# Patient Record
Sex: Male | Born: 1945 | Hispanic: No | Marital: Married | Smoking: Never smoker
Health system: Southern US, Community
[De-identification: ages and names within clinical notes are randomized; demographics above are authoritative.]

## PROBLEM LIST (undated history)

## (undated) DIAGNOSIS — K219 Gastro-esophageal reflux disease without esophagitis: Secondary | ICD-10-CM

## (undated) DIAGNOSIS — F419 Anxiety disorder, unspecified: Secondary | ICD-10-CM

## (undated) DIAGNOSIS — K5792 Diverticulitis of intestine, part unspecified, without perforation or abscess without bleeding: Secondary | ICD-10-CM

## (undated) DIAGNOSIS — IMO0002 Reserved for concepts with insufficient information to code with codable children: Secondary | ICD-10-CM

## (undated) DIAGNOSIS — I10 Essential (primary) hypertension: Secondary | ICD-10-CM

## (undated) HISTORY — DX: Reserved for concepts with insufficient information to code with codable children: IMO0002

## (undated) HISTORY — PX: APPENDECTOMY: SHX54

## (undated) HISTORY — DX: Essential (primary) hypertension: I10

## (undated) HISTORY — DX: Gastro-esophageal reflux disease without esophagitis: K21.9

## (undated) HISTORY — DX: Diverticulitis of intestine, part unspecified, without perforation or abscess without bleeding: K57.92

## (undated) HISTORY — DX: Anxiety disorder, unspecified: F41.9

## (undated) HISTORY — PX: CHOLECYSTECTOMY: SHX55

---

## 2003-08-01 ENCOUNTER — Other Ambulatory Visit: Payer: Self-pay

## 2008-04-25 ENCOUNTER — Ambulatory Visit: Payer: Self-pay | Admitting: Rheumatology

## 2008-04-27 ENCOUNTER — Ambulatory Visit: Payer: Self-pay | Admitting: Rheumatology

## 2008-08-05 ENCOUNTER — Ambulatory Visit: Payer: Self-pay

## 2008-09-29 ENCOUNTER — Ambulatory Visit: Payer: Self-pay | Admitting: Internal Medicine

## 2010-09-27 ENCOUNTER — Ambulatory Visit: Payer: Self-pay

## 2011-06-21 ENCOUNTER — Ambulatory Visit: Payer: Self-pay | Admitting: Anesthesiology

## 2011-06-26 ENCOUNTER — Encounter: Payer: Self-pay | Admitting: Physical Medicine & Rehabilitation

## 2011-07-12 ENCOUNTER — Encounter: Payer: Self-pay | Admitting: Physical Medicine & Rehabilitation

## 2011-07-12 ENCOUNTER — Encounter: Payer: Medicare Other | Attending: Physical Medicine & Rehabilitation

## 2011-07-12 ENCOUNTER — Ambulatory Visit (HOSPITAL_BASED_OUTPATIENT_CLINIC_OR_DEPARTMENT_OTHER): Payer: Medicare Other | Admitting: Physical Medicine & Rehabilitation

## 2011-07-12 VITALS — BP 121/82 | HR 66 | Resp 14 | Ht 70.0 in | Wt 218.0 lb

## 2011-07-12 DIAGNOSIS — M47817 Spondylosis without myelopathy or radiculopathy, lumbosacral region: Secondary | ICD-10-CM

## 2011-07-12 DIAGNOSIS — M51379 Other intervertebral disc degeneration, lumbosacral region without mention of lumbar back pain or lower extremity pain: Secondary | ICD-10-CM | POA: Insufficient documentation

## 2011-07-12 DIAGNOSIS — M5137 Other intervertebral disc degeneration, lumbosacral region: Secondary | ICD-10-CM | POA: Insufficient documentation

## 2011-07-12 DIAGNOSIS — M47816 Spondylosis without myelopathy or radiculopathy, lumbar region: Secondary | ICD-10-CM

## 2011-07-12 DIAGNOSIS — M48061 Spinal stenosis, lumbar region without neurogenic claudication: Secondary | ICD-10-CM | POA: Insufficient documentation

## 2011-07-12 DIAGNOSIS — G894 Chronic pain syndrome: Secondary | ICD-10-CM | POA: Insufficient documentation

## 2011-07-12 NOTE — Patient Instructions (Addendum)
We will need to obtain and analyze the urine drug screen prior to prescribing any type of narcotic analgesics.  If you cannot provide a urine sample for [place] I can still do the injection treatment that we discussed.   If you are running low he'll need to contact whoever prescribed these medications for you in the past.  I am recommending lumbar medial branch blocks. A pamphlet is enclosed.

## 2011-07-12 NOTE — Progress Notes (Signed)
Subjective:    Patient ID: Tyler Hicks, male    DOB: [birth year], 66 y.o.   MRN: [id number]  HPI Chronic low back pain for many years.stopped working in 2009 were 2010. Has been on OxyContin for 6 months. Has been on Vicodin for several years. Last CT was reviewed. The disc from [place] revealed L4-L5 and L5-S1 facet arthropathy as well as moderate central spinal stenosis at L4-L5 level. He has declined surgery in the past. Pain Inventory Average Pain 8 Pain Right Now 7 My pain is sharp and aching  In the last 24 hours, has pain interfered with the following? General activity 7 Relation with others 5 Enjoyment of life 10 What TIME of day is your pain at its worst? varies Sleep (in general) Poor  Pain is worse with: walking, sitting and standing Pain improves with: rest and medication Relief from Meds: 8  Mobility walk without assistance how many minutes can you walk? 5 ability to climb steps?  yes do you drive?  yes Do you have any goals in this area?  yes  Function disabled: date disabled 2009  Neuro/Psych numbness trouble walking  Prior Studies Any changes since last visit?  no  Physicians involved in your care Any changes since last visit?  no   History reviewed. No pertinent family history. History   Social History  . Marital Status: Unknown    Spouse Name: N/A    Number of Children: N/A  . Years of Education: N/A   Social History Main Topics  . Smoking status: Never Smoker   . Smokeless tobacco: Never Used  . Alcohol Use: No  . Drug Use: None  . Sexually Active: None   Other Topics Concern  . None   Social History Narrative  . None   Past Surgical History  Procedure Date  . Cholecystectomy   . Appendectomy    Past Medical History  Diagnosis Date  . Ulcer   . Diverticulitis    BP 121/82  Pulse 66  Resp 14  Ht 5\' 10"  (1.778 m)  Wt 218 lb (98.884 kg)  BMI 31.28 kg/m2  SpO2 95%     Review of Systems  Neurological: Positive  for numbness.  All other systems reviewed and are negative.       Objective:   Physical Exam  Musculoskeletal:       Lumbar back: He exhibits decreased range of motion and pain. He exhibits no swelling and no edema.       Pain with leaning toward the right side in the right low back area  Neurological: He has normal strength. A sensory deficit is present. He exhibits normal muscle tone. Gait normal.  Reflex Scores:      Patellar reflexes are 2+ on the right side and 2+ on the left side.      Achilles reflexes are 1+ on the right side and 1+ on the left side.      reduced sensation to pinprick in bilateral feet Motor strength is normal but some inconsistency with examination. When he ambulates there is no evidence of foot drop however he can barely raise up his ankle on annual muscle testing.  Psychiatric: His speech is normal. Judgment and thought content normal. His affect is blunt. He is slowed. Cognition and memory are normal.          Assessment & Plan:  1. Lumbar spinal stenosis 2 degenerative disc as well as lumbar facet arthropathy primarily at the L4-L5 level. No sign  of radiculopathy other than some foot numbness. This may be more do to a neuropathy and an EMG may be helpful in this regard. In regards to his back pain and lumbar spondylosis he may benefit from medial branch blocks however he declines this option 2. Chronic pain syndrome he is on high-dose narcotic analgesics greater than 100 mg morphine equivalence.patient states that he cannot provide a urine sample and I indicated that we would not feel as soon the prescription of his narcotic analgesics unless he is able to provide one.

## 2011-07-16 ENCOUNTER — Telehealth: Payer: Self-pay | Admitting: Physical Medicine & Rehabilitation

## 2011-07-16 NOTE — Telephone Encounter (Signed)
Per April, pt called back and I asked her to let pt know that we do UDS on a random basis. We do not let the pt decide when they want to give specimen.

## 2011-07-16 NOTE — Telephone Encounter (Signed)
Can he come this morning before lunch to give urine sample?

## 2011-07-16 NOTE — Telephone Encounter (Signed)
LM for pt to call back.

## 2011-07-17 ENCOUNTER — Telehealth: Payer: Self-pay | Admitting: Physical Medicine & Rehabilitation

## 2011-07-17 NOTE — Telephone Encounter (Signed)
Dr. Wynn Banker, This is the pt you had last week that states that he only urinates 3 times a day and couldn't give a specimen the day of his appointment. He has called several times wanting to know if he could just come in to give a specimen first thing in the morning. We have told him that we do UDS on a random basis and don't normally allow patients to just come in for a specimen. Could we make an exception for this pt?

## 2011-07-17 NOTE — Telephone Encounter (Signed)
Please call concerning this patient.  He is a legitimate patient and they want him to get some help.  What can they do to help?  Please call.

## 2011-07-18 NOTE — Telephone Encounter (Signed)
No exception

## 2011-08-06 ENCOUNTER — Ambulatory Visit: Payer: Self-pay | Admitting: Pain Medicine

## 2011-08-09 ENCOUNTER — Telehealth: Payer: Self-pay | Admitting: Physical Medicine & Rehabilitation

## 2011-08-16 ENCOUNTER — Ambulatory Visit: Payer: Medicare Other | Admitting: Physical Medicine & Rehabilitation

## 2011-10-07 ENCOUNTER — Encounter: Payer: Self-pay | Admitting: Anesthesiology

## 2011-10-20 ENCOUNTER — Encounter: Payer: Self-pay | Admitting: Anesthesiology

## 2012-04-05 ENCOUNTER — Emergency Department: Payer: Self-pay | Admitting: Internal Medicine

## 2012-04-07 ENCOUNTER — Emergency Department: Payer: Self-pay | Admitting: Emergency Medicine

## 2012-04-17 ENCOUNTER — Emergency Department: Payer: Self-pay | Admitting: Emergency Medicine

## 2017-05-05 ENCOUNTER — Other Ambulatory Visit: Payer: Self-pay | Admitting: Family Medicine

## 2017-05-05 ENCOUNTER — Ambulatory Visit
Admission: RE | Admit: 2017-05-05 | Discharge: 2017-05-05 | Disposition: A | Discharge status: Home / Self Care | Payer: Medicare Other | Source: Ambulatory Visit | Attending: Family Medicine | Admitting: Family Medicine

## 2017-05-05 DIAGNOSIS — R413 Other amnesia: Secondary | ICD-10-CM | POA: Diagnosis present

## 2017-05-05 DIAGNOSIS — R51 Headache: Secondary | ICD-10-CM | POA: Diagnosis present

## 2017-05-05 DIAGNOSIS — R2689 Other abnormalities of gait and mobility: Secondary | ICD-10-CM | POA: Insufficient documentation

## 2017-05-05 DIAGNOSIS — G319 Degenerative disease of nervous system, unspecified: Secondary | ICD-10-CM | POA: Insufficient documentation

## 2017-05-05 DIAGNOSIS — I7090 Unspecified atherosclerosis: Secondary | ICD-10-CM | POA: Diagnosis not present

## 2017-05-20 ENCOUNTER — Encounter (INDEPENDENT_AMBULATORY_CARE_PROVIDER_SITE_OTHER): Payer: Medicare Other | Admitting: Vascular Surgery

## 2017-05-22 ENCOUNTER — Encounter (INDEPENDENT_AMBULATORY_CARE_PROVIDER_SITE_OTHER): Payer: Self-pay | Admitting: Vascular Surgery

## 2017-05-22 ENCOUNTER — Ambulatory Visit (INDEPENDENT_AMBULATORY_CARE_PROVIDER_SITE_OTHER): Payer: Medicare Other | Admitting: Vascular Surgery

## 2017-05-22 DIAGNOSIS — I6523 Occlusion and stenosis of bilateral carotid arteries: Secondary | ICD-10-CM

## 2017-05-22 DIAGNOSIS — E785 Hyperlipidemia, unspecified: Secondary | ICD-10-CM | POA: Diagnosis not present

## 2017-05-22 DIAGNOSIS — I10 Essential (primary) hypertension: Secondary | ICD-10-CM | POA: Diagnosis not present

## 2017-05-23 ENCOUNTER — Ambulatory Visit
Admission: RE | Admit: 2017-05-23 | Discharge: 2017-05-23 | Disposition: A | Discharge status: Home / Self Care | Payer: Medicare Other | Source: Ambulatory Visit | Attending: Vascular Surgery | Admitting: Vascular Surgery

## 2017-05-23 ENCOUNTER — Ambulatory Visit (HOSPITAL_COMMUNITY): Payer: Medicare Other

## 2017-05-23 DIAGNOSIS — I6523 Occlusion and stenosis of bilateral carotid arteries: Secondary | ICD-10-CM

## 2017-05-23 DIAGNOSIS — I6529 Occlusion and stenosis of unspecified carotid artery: Secondary | ICD-10-CM | POA: Insufficient documentation

## 2017-05-23 DIAGNOSIS — I671 Cerebral aneurysm, nonruptured: Secondary | ICD-10-CM | POA: Insufficient documentation

## 2017-05-23 DIAGNOSIS — I6503 Occlusion and stenosis of bilateral vertebral arteries: Secondary | ICD-10-CM | POA: Insufficient documentation

## 2017-05-23 DIAGNOSIS — I708 Atherosclerosis of other arteries: Secondary | ICD-10-CM | POA: Insufficient documentation

## 2017-05-23 DIAGNOSIS — I6522 Occlusion and stenosis of left carotid artery: Secondary | ICD-10-CM | POA: Diagnosis not present

## 2017-05-23 DIAGNOSIS — I700 Atherosclerosis of aorta: Secondary | ICD-10-CM | POA: Insufficient documentation

## 2017-05-23 MED ORDER — IOHEXOL 350 MG/ML SOLN
75.0000 mL | Freq: Once | INTRAVENOUS | Status: AC | PRN
  Administered 2017-05-23: 75 mL via INTRAVENOUS

## 2017-05-24 ENCOUNTER — Encounter (INDEPENDENT_AMBULATORY_CARE_PROVIDER_SITE_OTHER): Payer: Self-pay | Admitting: Vascular Surgery

## 2017-05-24 DIAGNOSIS — I10 Essential (primary) hypertension: Secondary | ICD-10-CM | POA: Insufficient documentation

## 2017-05-24 DIAGNOSIS — E785 Hyperlipidemia, unspecified: Secondary | ICD-10-CM | POA: Insufficient documentation

## 2017-05-24 NOTE — Progress Notes (Signed)
Subjective:    Patient ID: Tyler Hicks, male    DOB: 10-19-1945, 72 y.o.   MRN: [id number] Chief Complaint  Patient presents with  . New Patient (Initial Visit)    ref Hande for Carotid Stenosis   Presents as a new patient referred by physician assistant Whitaker for evaluation of carotid artery stenosis.  The patient was seen with his wife.  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  The patient is very concerned about this loss of memory and how it may be "regained".  The patient denies any other neurological symptoms or deficits associated with this episode of blindness/memory loss.  The patient denies any episodes of amaurosis fugax before this episode as well. This prompted a carotid artery ultrasound which was notable for 50-69% stenosis in the bilateral internal carotid arteries.  Bilateral vertebral arteries were antegrade.  The patient's wife is concerned about his lack of energy and shortness of breath.  Patient denies any fever, nausea or vomiting.  Review of Systems  Constitutional: Negative.   HENT: Negative.   Eyes: Positive for visual disturbance.  Respiratory: Negative.   Cardiovascular: Negative.        Carotid stenosis  Gastrointestinal: Negative.   Endocrine: Negative.   Genitourinary: Negative.   Musculoskeletal: Negative.   Skin: Negative.   Allergic/Immunologic: Negative.   Neurological: Positive for weakness.       Memory loss  Hematological: Negative.   Psychiatric/Behavioral: Negative.       Objective:   Physical Exam  Constitutional: He is oriented to person, place, and time. He appears well-developed and well-nourished. No distress.  HENT:  Head: Normocephalic and atraumatic.  Eyes: Pupils are equal, round, and reactive to light. Conjunctivae are normal.  Neck: Normal range of motion.  Mild bilateral carotid bruits noted on exam  Cardiovascular:  Normal rate, regular rhythm, normal heart sounds and intact distal pulses.  Pulses:      Radial pulses are 2+ on the right side, and 2+ on the left side.  Pulmonary/Chest: Effort normal and breath sounds normal.  Musculoskeletal: Normal range of motion. He exhibits no edema.  Neurological: He is alert and oriented to person, place, and time.  Skin: Skin is warm and dry. He is not diaphoretic.  Psychiatric: He has a normal mood and affect. His behavior is normal. Judgment and thought content normal.  Vitals reviewed.  BP (!) 144/80 (BP Location: Right Arm)   Pulse (!) 55   Resp 16   Ht 5\' 11"  (1.803 m)   Wt 225 lb (102.1 kg)   BMI 31.38 kg/m   Past Medical History:  Diagnosis Date  . Anxiety   . Diverticulitis   . GERD (gastroesophageal reflux disease)   . Hypertension   . Ulcer    Social History   Socioeconomic History  . Marital status: Married    Spouse name: Not on file  . Number of children: Not on file  . Years of education: Not on file  . Highest education level: Not on file  Occupational History  . Not on file  Social Needs  . Financial resource strain: Not on file  . Food insecurity:    Worry: Not on file    Inability: Not on file  . Transportation needs:    Medical: Not on file    Non-medical: Not on file  Tobacco Use  . Smoking status:  Never Smoker  . Smokeless tobacco: Never Used  Substance and Sexual Activity  . Alcohol use: No  . Drug use: Not on file  . Sexual activity: Not on file  Lifestyle  . Physical activity:    Days per week: Not on file    Minutes per session: Not on file  . Stress: Not on file  Relationships  . Social connections:    Talks on phone: Not on file    Gets together: Not on file    Attends religious service: Not on file    Active member of club or organization: Not on file    Attends meetings of clubs or organizations: Not on file    Relationship status: Not on file  . Intimate partner violence:    Fear of current or  ex partner: Not on file    Emotionally abused: Not on file    Physically abused: Not on file    Forced sexual activity: Not on file  Other Topics Concern  . Not on file  Social History Narrative  . Not on file   Past Surgical History:  Procedure Laterality Date  . APPENDECTOMY    . CHOLECYSTECTOMY     Family History  Problem Relation Age of Onset  . Varicose Veins Neg Hx    Allergies  Allergen Reactions  . Amoxicillin-Pot Clavulanate Nausea Only and Nausea And Vomiting      Assessment & Plan:  Presents as a new patient referred by physician assistant Whitaker for evaluation of carotid artery stenosis.  The patient was seen with his wife.  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  The patient is very concerned about this loss of memory and how it may be "regained".  The patient denies any other neurological symptoms or deficits associated with this episode of blindness/memory loss.  The patient denies any episodes of amaurosis fugax before this episode as well. This prompted a carotid artery ultrasound which was notable for 50-69% stenosis in the bilateral internal carotid arteries.  Bilateral vertebral arteries were antegrade.  The patient's wife is concerned about his lack of energy and shortness of breath.  Patient denies any fever, nausea or vomiting.  1. Bilateral carotid artery stenosis - New Patient with 50-69% stenosis to the bilateral internal carotid arteries Patient with one episode of blindness and memory loss lasting for a couple of hours approximately 2 weeks ago Do to the severity of the patient's symptoms not correlating to the degree of stenosis seen on his carotid duplex I will order a CTA of the neck to assess the patient's anatomy and exact degree of contributing carotid artery disease Once the patient completes the CTA of the neck he will follow-up with Dr. Gilda Crease to  discuss the results The patient and his wife agree and expressed their understanding with the plan  - CT Angio Neck W/Cm &/Or Wo/Cm; Future - BUN+Creat; Future  2. Hyperlipidemia, unspecified hyperlipidemia type - Stable Encouraged good control as its slows the progression of atherosclerotic disease  3. Essential hypertension - Stable Encouraged good control as its slows the progression of atherosclerotic disease  Current Outpatient Medications on File Prior to Visit  Medication Sig Dispense Refill  . ALPRAZolam (XANAX) 1 MG tablet Take 1 mg by mouth 2 (two) times daily.    . Beclomethasone Dipropionate (QNASL) 80 MCG/ACT AERS Place into the nose.    Marland Kitchen  esomeprazole (NEXIUM) 40 MG capsule Take 40 mg by mouth daily before breakfast.    . gabapentin (NEURONTIN) 300 MG capsule Take 600 mg by mouth 3 (three) times daily.     Marland Kitchen. loratadine (CLARITIN) 10 MG tablet Take 10 mg by mouth daily.    . meloxicam (MOBIC) 15 MG tablet Take 15 mg by mouth daily.    . metoprolol tartrate (LOPRESSOR) 25 MG tablet Take 25 mg by mouth 2 (two) times daily.    Marland Kitchen. oxyCODONE (OXYCONTIN) 20 MG 12 hr tablet Take 20 mg by mouth every 12 (twelve) hours.    . potassium chloride SA (K-DUR,KLOR-CON) 20 MEQ tablet Take 20 mEq by mouth daily.    . silver sulfADIAZINE (SILVADENE) 1 % cream Apply topically.    . simvastatin (ZOCOR) 10 MG tablet take 1 tablet by mouth at bedtime    . tamsulosin (FLOMAX) 0.4 MG CAPS capsule   0  . esomeprazole (NEXIUM) 40 MG capsule Take 40 mg by mouth daily at 12 noon.    . hydrochlorothiazide (HYDRODIURIL) 25 MG tablet Take 25 mg by mouth daily.    Marland Kitchen. HYDROcodone-acetaminophen (NORCO) 10-325 MG per tablet Take 1 tablet by mouth every 6 (six) hours as needed.    . pseudoephedrine (SUDAFED) 30 MG tablet Take 30 mg by mouth 2 (two) times daily.     No current facility-administered medications on file prior to visit.    There are no Patient Instructions on file for this visit. No follow-ups  on file.  KIMBERLY A STEGMAYER, PA-C

## 2017-06-02 ENCOUNTER — Ambulatory Visit (INDEPENDENT_AMBULATORY_CARE_PROVIDER_SITE_OTHER): Payer: Medicare Other | Admitting: Vascular Surgery

## 2017-06-05 ENCOUNTER — Encounter (INDEPENDENT_AMBULATORY_CARE_PROVIDER_SITE_OTHER): Payer: Self-pay | Admitting: Vascular Surgery

## 2017-06-05 ENCOUNTER — Ambulatory Visit (INDEPENDENT_AMBULATORY_CARE_PROVIDER_SITE_OTHER): Payer: Medicare Other | Admitting: Vascular Surgery

## 2017-06-05 VITALS — BP 120/78 | HR 75 | Resp 17 | Ht 71.0 in | Wt 226.0 lb

## 2017-06-05 DIAGNOSIS — I720 Aneurysm of carotid artery: Secondary | ICD-10-CM

## 2017-06-05 DIAGNOSIS — I10 Essential (primary) hypertension: Secondary | ICD-10-CM

## 2017-06-05 DIAGNOSIS — I7771 Dissection of carotid artery: Secondary | ICD-10-CM | POA: Diagnosis not present

## 2017-06-05 DIAGNOSIS — E785 Hyperlipidemia, unspecified: Secondary | ICD-10-CM

## 2017-06-05 DIAGNOSIS — I6523 Occlusion and stenosis of bilateral carotid arteries: Secondary | ICD-10-CM

## 2017-06-07 ENCOUNTER — Encounter (INDEPENDENT_AMBULATORY_CARE_PROVIDER_SITE_OTHER): Payer: Self-pay | Admitting: Vascular Surgery

## 2017-06-07 DIAGNOSIS — I7771 Dissection of carotid artery: Secondary | ICD-10-CM | POA: Insufficient documentation

## 2017-06-07 DIAGNOSIS — I720 Aneurysm of carotid artery: Secondary | ICD-10-CM | POA: Insufficient documentation

## 2017-06-07 NOTE — Progress Notes (Signed)
MRN : [id number]  Tyler Hicks is a 72 y.o. (Jun 22, 1945) male who presents with chief complaint of  Chief Complaint  Patient presents with  . Follow-up    ct results  .  History of Present Illness:  The patient is seen for follow up evaluation of carotid stenosis status post CT angiogram. CT scan was done 05/23/2017. Patient reports that the test went well with no problems or complications.   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  He has not had a recurrence.  The patient is very concerned about this loss of memory and how it may be "regained" because he apparently misplaced a large sum of money and can't find it.  The patient denies any other neurological symptoms or deficits associated with this episode of blindness/memory loss.  The patient denies interval amaurosis fugax. There is no recent or interval TIA symptoms or focal motor deficits. There is no prior documented CVA.  The patient is taking enteric-coated aspirin 81 mg daily.  There is no history of migraine headaches. There is no history of seizures.  The patient has a history of coronary artery disease, no recent episodes of angina or shortness of breath. The patient denies PAD or claudication symptoms. There is a history of hyperlipidemia which is being treated with a statin.    CT angiogram is reviewed by me personally and shows approximately 60% bilateral stenosis at the origins of the internal carotid arteries.  On the right side the stenosis is associated with a chronic dissection.  Also noted is a 8 mm distal internal carotid artery aneurysm on the right.  This is located just below the siphon.    Current Meds  Medication Sig  . ALPRAZolam (XANAX) 1 MG tablet Take 1 mg by mouth 2 (two) times daily.  . Beclomethasone Dipropionate (QNASL) 80 MCG/ACT AERS Place into the nose.  . esomeprazole (NEXIUM) 40 MG  capsule Take 40 mg by mouth daily before breakfast.  . esomeprazole (NEXIUM) 40 MG capsule Take 40 mg by mouth daily at 12 noon.  . gabapentin (NEURONTIN) 300 MG capsule Take 600 mg by mouth 3 (three) times daily.   . hydrochlorothiazide (HYDRODIURIL) 25 MG tablet Take 25 mg by mouth daily.  Marland Kitchen HYDROcodone-acetaminophen (NORCO) 10-325 MG per tablet Take 1 tablet by mouth every 6 (six) hours as needed.  . loratadine (CLARITIN) 10 MG tablet Take 10 mg by mouth daily.  . meloxicam (MOBIC) 15 MG tablet Take 15 mg by mouth daily.  . metoprolol tartrate (LOPRESSOR) 25 MG tablet Take 25 mg by mouth 2 (two) times daily.  Marland Kitchen oxyCODONE (OXYCONTIN) 20 MG 12 hr tablet Take 20 mg by mouth every 12 (twelve) hours.  . potassium chloride SA (K-DUR,KLOR-CON) 20 MEQ tablet Take 20 mEq by mouth daily.  . pseudoephedrine (SUDAFED) 30 MG tablet Take 30 mg by mouth 2 (two) times daily.  . silver sulfADIAZINE (SILVADENE) 1 % cream Apply topically.  . simvastatin (ZOCOR) 10 MG tablet take 1 tablet by mouth at bedtime  . tamsulosin (FLOMAX) 0.4 MG CAPS capsule     Past Medical History:  Diagnosis Date  . Anxiety   . Diverticulitis   . GERD (gastroesophageal reflux disease)   . Hypertension   . Ulcer     Past Surgical History:  Procedure Laterality Date  . APPENDECTOMY    . CHOLECYSTECTOMY  Social History Social History   Tobacco Use  . Smoking status: Never Smoker  . Smokeless tobacco: Never Used  Substance Use Topics  . Alcohol use: No  . Drug use: Not on file    Family History Family History  Problem Relation Age of Onset  . Varicose Veins Neg Hx     Allergies  Allergen Reactions  . Amoxicillin-Pot Clavulanate Nausea Only and Nausea And Vomiting     REVIEW OF SYSTEMS (Negative unless checked)  Constitutional: [] Weight loss  [] Fever  [] Chills Cardiac: [] Chest pain   [] Chest pressure   [] Palpitations   [] Shortness of breath when laying flat   [] Shortness of breath with  exertion. Vascular:  [] Pain in legs with walking   [] Pain in legs at rest  [] History of DVT   [] Phlebitis   [] Swelling in legs   [] Varicose veins   [] Non-healing ulcers Pulmonary:   [] Uses home oxygen   [] Productive cough   [] Hemoptysis   [] Wheeze  [] COPD   [] Asthma Neurologic:  [] Dizziness   [] Seizures   [] History of stroke   [] History of TIA  [] Aphasia   [] Vissual changes   [] Weakness or numbness in arm   [] Weakness or numbness in leg Musculoskeletal:   [] Joint swelling   [] Joint pain   [] Low back pain Hematologic:  [] Easy bruising  [] Easy bleeding   [] Hypercoagulable state   [] Anemic Gastrointestinal:  [] Diarrhea   [] Vomiting  [] Gastroesophageal reflux/heartburn   [] Difficulty swallowing. Genitourinary:  [] Chronic kidney disease   [] Difficult urination  [] Frequent urination   [] Blood in urine Skin:  [] Rashes   [] Ulcers  Psychological:  [] History of anxiety   []  History of major depression.  Physical Examination  Vitals:   06/05/17 1125  BP: 120/78  Pulse: 75  Resp: 17  Weight: 226 lb (102.5 kg)  Height: 5\' 11"  (1.803 m)   Body mass index is 31.52 kg/m. Gen: WD/WN, NAD Head: [place]/AT, No temporalis wasting.  Ear/Nose/Throat: Hearing grossly intact, nares w/o erythema or drainage Eyes: PER, EOMI, sclera nonicteric.  Neck: Supple, no large masses.   Pulmonary:  Good air movement, no audible wheezing bilaterally, no use of accessory muscles.  Cardiac: RRR, no JVD Vascular:  Bilateral carotid bruits Vessel Right Left  Radial Palpable Palpable  Ulnar Palpable Palpable  Brachial Palpable Palpable  Carotid Palpable Palpable  Gastrointestinal: Non-distended. No guarding/no peritoneal signs.  Musculoskeletal: M/S 5/5 throughout.  No deformity or atrophy.  Neurologic: CN 2-12 intact. Symmetrical.  Speech is fluent. Motor exam as listed above. Psychiatric: Judgment intact, Mood & affect appropriate for pt's clinical situation. Dermatologic: No rashes or ulcers noted.  No changes  consistent with cellulitis. Lymph : No lichenification or skin changes of chronic lymphedema.  CBC No results found for: WBC, HGB, HCT, MCV, PLT  BMET No results found for: NA, K, CL, CO2, GLUCOSE, BUN, CREATININE, CALCIUM, GFRNONAA, GFRAA CrCl cannot be calculated (No order found.).  COAG No results found for: INR, PROTIME  Radiology Ct Angio Neck W/cm &/or Wo/cm  Result Date: 05/23/2017 CLINICAL DATA:  Recent TIA. Memory loss and visual disturbances for week. Follow-up carotid artery stenosis. EXAM: CT ANGIOGRAPHY NECK TECHNIQUE: Multidetector CT imaging of the neck was performed using the standard protocol during bolus administration of intravenous contrast. Multiplanar CT image reconstructions and MIPs were obtained to evaluate the vascular anatomy. Carotid stenosis measurements (when applicable) are obtained utilizing NASCET criteria, using the distal internal carotid diameter as the denominator. CONTRAST:  75mL OMNIPAQUE IOHEXOL 350 MG/ML SOLN COMPARISON:  Carotid ultrasound  May 15, 2017 FINDINGS: AORTIC ARCH: Normal appearance of the thoracic arch, 2 vessel arch is a normal variant. Mild intimal irregularity and calcific atherosclerosis. Moderate LEFT Common carotid artery origin stenosis. Moderate tandem stenosis bilateral subclavian arteries. RIGHT CAROTID SYSTEM: Common carotid artery is patent. Dissection flap with 3 x 4 mm medially directed patent pseudoaneurysm. No hemodynamically significant stenosis by NASCET criteria. 8 mm fusiform aneurysm distal RIGHT cervical internal carotid artery. LEFT CAROTID SYSTEM: Common carotid artery is patent. Intimal thickening resulting in 60% focal stenosis LEFT internal carotid artery by NASCET criteria. 3 x 3 mm outpouching LEFT internal carotid artery origin (coronal 122/255) VERTEBRAL ARTERIES:Moderate stenosis bilateral vertebral artery origins. Vessels are patent. RIGHT vertebral artery is dominant. Tandem stenosis LEFT vertebral artery, severe  in mid V2 segment. SKELETON: No acute osseous process though bone windows have not been submitted. Poor dentition. 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 Moderate to severe bilateral C5-6 neural foraminal narrowing. OTHER NECK: Soft tissues of the neck are nonacute though, not tailored for evaluation. UPPER CHEST: Included lung apices are clear. Subcentimeter paratracheal nonspecific lymph nodes. IMPRESSION: 1. 60% stenosis LEFT internal carotid artery origin. 3 mm LEFT ICA origin outpouching concerning for ulcerated plaque, less likely pseudoaneurysm. 2. Old RIGHT internal carotid artery origin dissection with 3 x 4 mm intact pseudoaneurysm versus ulcerative plaque. No hemodynamically significant stenosis. 8 mm distal RIGHT ICA aneurysm. 3. Atherosclerosis resulting in moderate stenoses bilateral subclavian arteries. 4. Moderate stenosis bilateral vertebral artery origins. Tandem stenosis LEFT vertebral artery, severe in mid V2 segment seen with old dissection or atherosclerosis. 5. These results will be called to the ordering clinician or representative by the Radiology Department at the imaging location. Aortic Atherosclerosis (ICD10-I70.0). Electronically Signed   By: Awilda Metroourtnay  Bloomer M.D.   On: 05/23/2017 18:54    Assessment/Plan 1. Bilateral carotid artery stenosis Recommend:  Given the patient's asymptomatic subcritical stenosis no further invasive testing or surgery at this time.  Duplex ultrasound shows 60% stenosis bilaterally.  Continue antiplatelet therapy as prescribed Continue management of CAD, HTN and Hyperlipidemia Healthy heart diet,  encouraged exercise at least 4 times per week Follow up in 6 months with duplex ultrasound and physical exam based on 60% stenosis of the bilateral carotid artery   Given the memory loss and visual changes which is a global and bilateral episode I will ask neurology to evaluate.  There is nothing on the CT scan that would explain these  symptoms.   A total of 25 minutes was spent with this patient and greater than 50% was spent in counseling and coordination of care with the patient.  Discussion included the treatment options for vascular disease including indications for surgery and intervention.  Also discussed is the appropriate timing of treatment.  In addition medical therapy was discussed.  2. Carotid artery dissection (HCC) This will be monitored as with the carotid stenosis  3. Carotid aneurysm, right (HCC) See #1 - CT ANGIO NECK W OR WO CONTRAST; Future  4. Essential hypertension Continue antihypertensive medications as already ordered, these medications have been reviewed and there are no changes at this time.   5. Hyperlipidemia, unspecified hyperlipidemia type Continue statin as ordered and reviewed, no changes at this time     Levora DredgeGregory Shekina Cordell, MD  06/07/2017 11:51 AM

## 2017-06-16 ENCOUNTER — Telehealth (INDEPENDENT_AMBULATORY_CARE_PROVIDER_SITE_OTHER): Payer: Self-pay | Admitting: Vascular Surgery

## 2017-06-16 NOTE — Telephone Encounter (Signed)
Patient was referred to Dr. Margaretmary Eddy office, they will call him to schedule his appt.

## 2017-11-29 ENCOUNTER — Encounter: Payer: Self-pay | Admitting: Physician Assistant

## 2017-11-29 ENCOUNTER — Emergency Department
Admission: EM | Admit: 2017-11-29 | Discharge: 2017-11-30 | Disposition: A | Discharge status: Home / Self Care | Payer: Medicare Other | Attending: Emergency Medicine | Admitting: Emergency Medicine

## 2017-11-29 ENCOUNTER — Emergency Department: Payer: Medicare Other

## 2017-11-29 ENCOUNTER — Other Ambulatory Visit: Payer: Self-pay

## 2017-11-29 DIAGNOSIS — Y999 Unspecified external cause status: Secondary | ICD-10-CM | POA: Insufficient documentation

## 2017-11-29 DIAGNOSIS — Z79899 Other long term (current) drug therapy: Secondary | ICD-10-CM | POA: Insufficient documentation

## 2017-11-29 DIAGNOSIS — Y929 Unspecified place or not applicable: Secondary | ICD-10-CM | POA: Diagnosis not present

## 2017-11-29 DIAGNOSIS — Y939 Activity, unspecified: Secondary | ICD-10-CM | POA: Diagnosis not present

## 2017-11-29 DIAGNOSIS — S20219A Contusion of unspecified front wall of thorax, initial encounter: Secondary | ICD-10-CM | POA: Diagnosis not present

## 2017-11-29 DIAGNOSIS — W01190A Fall on same level from slipping, tripping and stumbling with subsequent striking against furniture, initial encounter: Secondary | ICD-10-CM | POA: Insufficient documentation

## 2017-11-29 DIAGNOSIS — I10 Essential (primary) hypertension: Secondary | ICD-10-CM | POA: Diagnosis not present

## 2017-11-29 DIAGNOSIS — S0181XA Laceration without foreign body of other part of head, initial encounter: Secondary | ICD-10-CM | POA: Diagnosis present

## 2017-11-29 MED ORDER — LIDOCAINE HCL (PF) 1 % IJ SOLN
5.0000 mL | Freq: Once | INTRAMUSCULAR | Status: DC
Start: 2017-11-29 — End: 2017-11-30
  Filled 2017-11-29: qty 5

## 2017-11-29 NOTE — ED Notes (Signed)
Pt  Got dizzy and fell  Did  Not pass  Out fell  Hit  Chin and  Chest  Pt has  Pain in chest that can be reproduced  Hurts to take deep breath  Bleeding has  Subsided

## 2017-11-29 NOTE — Discharge Instructions (Addendum)
Keep the wound clean and dry. See Dr. Marcello Fennel in 3-5 days for suture removal. Wash the area with soap & water as needed.

## 2017-11-29 NOTE — ED Triage Notes (Signed)
Patient reports he spun around really quickly and became dizzy and fell on to table.  Denies loss of consciousness.  Patient with laceration to chin.

## 2017-11-30 NOTE — ED Provider Notes (Signed)
[facility] Emergency Department Provider Note ____________________________________________  Time seen: 2243  I have reviewed the triage vital signs and the nursing notes.  HISTORY  Chief Complaint  Laceration  HPI Tyler Hicks is a 72 y.o. male resents to the ED accompanied by his wife, for evaluation of a laceration to his chin.  Patient describes he spun around quickly and became lightheaded and dizzy.  He apparently fell hitting his chin on the table.  There was no reported loss of consciousness, dental injury, facial contusion, nosebleed, or neck pain.  Patient admits to some discomfort across his anterior chest when he hit the table.  No shortness of breath, distal paresthesias, or nonreproducible chest pain is noted.  Past Medical History:  Diagnosis Date  . Anxiety   . Diverticulitis   . GERD (gastroesophageal reflux disease)   . Hypertension   . Ulcer     Patient Active Problem List   Diagnosis Date Noted  . 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 Hyperlipidemia 05/24/2017  . Essential hypertension 05/24/2017  . Carotid stenosis 05/23/2017  . Lumbar spondylosis 07/12/2011  . Lumbar spinal stenosis 07/12/2011    Past Surgical History:  Procedure Laterality Date  . APPENDECTOMY    . CHOLECYSTECTOMY      Prior to Admission medications   Medication Sig Start Date End Date Taking? Authorizing Provider  ALPRAZolam Prudy Feeler) 1 MG tablet Take 1 mg by mouth 2 (two) times daily.    [provider]  Beclomethasone Dipropionate (QNASL) 80 MCG/ACT AERS Place into the nose. 01/18/14   [provider]  esomeprazole (NEXIUM) 40 MG capsule Take 40 mg by mouth daily before breakfast.    [provider]  esomeprazole (NEXIUM) 40 MG capsule Take 40 mg by mouth daily at 12 noon.    [provider]  gabapentin (NEURONTIN) 300 MG capsule Take 600 mg by mouth 3 (three) times  daily.     [provider]  hydrochlorothiazide (HYDRODIURIL) 25 MG tablet Take 25 mg by mouth daily.    [provider]  HYDROcodone-acetaminophen (NORCO) 10-325 MG per tablet Take 1 tablet by mouth every 6 (six) hours as needed.    [provider]  loratadine (CLARITIN) 10 MG tablet Take 10 mg by mouth daily.    [provider]  meloxicam (MOBIC) 15 MG tablet Take 15 mg by mouth daily.    [provider]  metoprolol tartrate (LOPRESSOR) 25 MG tablet Take 25 mg by mouth 2 (two) times daily.    [provider]  oxyCODONE (OXYCONTIN) 20 MG 12 hr tablet Take 20 mg by mouth every 12 (twelve) hours.    [provider]  potassium chloride SA (K-DUR,KLOR-CON) 20 MEQ tablet Take 20 mEq by mouth daily.    [provider]  pseudoephedrine (SUDAFED) 30 MG tablet Take 30 mg by mouth 2 (two) times daily.    [provider]  simvastatin (ZOCOR) 10 MG tablet take 1 tablet by mouth at bedtime 07/10/15   [provider]  tamsulosin (FLOMAX) 0.4 MG CAPS capsule  04/27/17   [provider]    Allergies Amoxicillin-pot clavulanate  Family History  Problem Relation Age of Onset  . Varicose Veins Neg Hx     Social History Social History   Tobacco Use  . Smoking status: Never Smoker  . Smokeless tobacco: Never Used  Substance Use Topics  . Alcohol use: No  . Drug use:  Not on file    Review of Systems  Constitutional: Negative for fever. Eyes: Negative for visual changes. ENT: Negative for sore throat. Cardiovascular: Negative for chest pain. Respiratory: Negative for shortness of breath. Gastrointestinal: Negative for abdominal pain, vomiting and diarrhea. Genitourinary: Negative for dysuria. Musculoskeletal: Negative for back pain.  Anterior chest wall pain as above. Skin: Negative for rash.  Chin laceration as above.   Neurological: Negative for headaches, focal weakness or  numbness. ____________________________________________  PHYSICAL EXAM:  VITAL SIGNS: ED Triage Vitals [11/29/17 2041]  Enc Vitals Group     BP 106/87     Pulse Rate 93     Resp 18     Temp 98.8 F (37.1 C)     Temp Source Oral     SpO2 96 %     Weight 232 lb (105.2 kg)     Height 6' (1.829 m)     Head Circumference      Peak Flow      Pain Score 4     Pain Loc      Pain Edu?      Excl. in GC?     Constitutional: Alert and oriented. Well appearing and in no distress. Head: Normocephalic and atraumatic, for a linear laceration to the inferior portion of the chin under the mandible.  No active bleeding is appreciated.. Eyes: Conjunctivae are normal. Normal extraocular movements Neck: Supple. Normal ROM Cardiovascular: Normal rate, regular rhythm. Normal distal pulses. Respiratory: Normal respiratory effort. No wheezes/rales/rhonchi. Musculoskeletal: Nontender with normal range of motion in all extremities.  Neurologic:  Normal gait without ataxia. Normal speech and language. No gross focal neurologic deficits are appreciated. Skin:  Skin is warm, dry and intact. No rash noted. ____________________________________________   RADIOLOGY  CXR  IMPRESSION: No evidence of active pulmonary disease. Slight fibrosis in the lung bases. ____________________________________________  PROCEDURES  .Marland KitchenLaceration Repair Date/Time: 11/30/2017 12:18 AM Performed by: Lissa Hoard, PA-C Authorized by: Lissa Hoard, PA-C   Consent:    Consent obtained:  Verbal   Consent given by:  Patient   Risks discussed:  Pain, poor cosmetic result and poor wound healing Anesthesia (see MAR for exact dosages):    Anesthesia method:  Local infiltration   Local anesthetic:  Lidocaine 1% w/o epi Laceration details:    Location:  Face   Face location:  Chin   Length (cm):  2.5 Repair type:    Repair type:  Simple Pre-procedure details:    Preparation:  Patient was prepped  and draped in usual sterile fashion Treatment:    Area cleansed with:  Betadine   Amount of cleaning:  Standard   Irrigation solution:  Sterile saline   Irrigation method:  Syringe Skin repair:    Repair method:  Sutures   Suture size:  4-0   Suture material:  Nylon   Suture technique:  Simple interrupted   Number of sutures:  4 Approximation:    Approximation:  Close Post-procedure details:    Dressing:  Open (no dressing)   Patient tolerance of procedure:  Tolerated well, no immediate complications  ____________________________________________  INITIAL IMPRESSION / ASSESSMENT AND PLAN / ED COURSE  Patient with ED evaluation of injury sustained following mechanical fall.  Patient sustained a linear laceration to the chin and a chest wall contusion.  He is reassured by his negative x-ray.  His chin is repaired with sutures with good wound edge approximation.  He is discharged with instructions on wound care  management, and will see his provider in 3 to 5 days for suture removal.  He may take his home medications as needed for pain relief. ____________________________________________  FINAL CLINICAL IMPRESSION(S) / ED DIAGNOSES  Final diagnoses:  Facial laceration, initial encounter  Contusion of chest wall, unspecified laterality, initial encounter      Lissa Hoard, PA-C 11/30/17 0021    Jeanmarie Plant, MD 11/30/17 2112

## 2017-12-04 ENCOUNTER — Other Ambulatory Visit: Payer: Self-pay | Admitting: Internal Medicine

## 2017-12-04 DIAGNOSIS — R270 Ataxia, unspecified: Secondary | ICD-10-CM

## 2017-12-04 DIAGNOSIS — S0181XA Laceration without foreign body of other part of head, initial encounter: Secondary | ICD-10-CM

## 2017-12-05 ENCOUNTER — Other Ambulatory Visit: Payer: Self-pay | Admitting: Internal Medicine

## 2017-12-05 DIAGNOSIS — R2681 Unsteadiness on feet: Secondary | ICD-10-CM

## 2017-12-05 DIAGNOSIS — R296 Repeated falls: Secondary | ICD-10-CM

## 2017-12-05 DIAGNOSIS — R270 Ataxia, unspecified: Secondary | ICD-10-CM

## 2017-12-05 DIAGNOSIS — R4182 Altered mental status, unspecified: Secondary | ICD-10-CM

## 2017-12-10 ENCOUNTER — Ambulatory Visit
Admission: RE | Admit: 2017-12-10 | Discharge: 2017-12-10 | Disposition: A | Discharge status: Home / Self Care | Payer: Medicare Other | Source: Ambulatory Visit | Attending: Vascular Surgery | Admitting: Vascular Surgery

## 2017-12-10 ENCOUNTER — Ambulatory Visit: Payer: Medicare Other

## 2017-12-10 DIAGNOSIS — R4182 Altered mental status, unspecified: Secondary | ICD-10-CM

## 2017-12-10 DIAGNOSIS — R296 Repeated falls: Secondary | ICD-10-CM

## 2017-12-10 DIAGNOSIS — I700 Atherosclerosis of aorta: Secondary | ICD-10-CM | POA: Diagnosis not present

## 2017-12-10 DIAGNOSIS — I6522 Occlusion and stenosis of left carotid artery: Secondary | ICD-10-CM | POA: Diagnosis not present

## 2017-12-10 DIAGNOSIS — R270 Ataxia, unspecified: Secondary | ICD-10-CM

## 2017-12-10 DIAGNOSIS — R2681 Unsteadiness on feet: Secondary | ICD-10-CM

## 2017-12-10 DIAGNOSIS — I720 Aneurysm of carotid artery: Secondary | ICD-10-CM

## 2017-12-10 MED ORDER — IOHEXOL 350 MG/ML SOLN
75.0000 mL | Freq: Once | INTRAVENOUS | Status: AC | PRN
  Administered 2017-12-10: 75 mL via INTRAVENOUS

## 2017-12-10 MED ORDER — IOPAMIDOL (ISOVUE-370) INJECTION 76%: 75.0000 mL | Freq: Once | INTRAVENOUS | Status: DC | PRN

## 2019-07-20 ENCOUNTER — Emergency Department: Payer: Medicare Other

## 2019-07-20 ENCOUNTER — Encounter: Payer: Self-pay | Admitting: Emergency Medicine

## 2019-07-20 ENCOUNTER — Emergency Department
Admission: EM | Admit: 2019-07-20 | Discharge: 2019-07-20 | Disposition: A | Discharge status: Home / Self Care | Payer: Medicare Other | Attending: Emergency Medicine | Admitting: Emergency Medicine

## 2019-07-20 ENCOUNTER — Other Ambulatory Visit: Payer: Self-pay

## 2019-07-20 DIAGNOSIS — Z5321 Procedure and treatment not carried out due to patient leaving prior to being seen by health care provider: Secondary | ICD-10-CM | POA: Insufficient documentation

## 2019-07-20 DIAGNOSIS — R200 Anesthesia of skin: Secondary | ICD-10-CM | POA: Insufficient documentation

## 2019-07-20 LAB — DIFFERENTIAL
Abs Immature Granulocytes: 0.02 10*3/uL (ref 0.00–0.07)
Basophils Absolute: 0 10*3/uL (ref 0.0–0.1)
Basophils Relative: 0 %
Eosinophils Absolute: 0.2 10*3/uL (ref 0.0–0.5)
Eosinophils Relative: 3 %
Immature Granulocytes: 0 %
Lymphocytes Relative: 26 %
Lymphs Abs: 2 10*3/uL (ref 0.7–4.0)
Monocytes Absolute: 0.6 10*3/uL (ref 0.1–1.0)
Monocytes Relative: 8 %
Neutro Abs: 4.9 10*3/uL (ref 1.7–7.7)
Neutrophils Relative %: 63 %

## 2019-07-20 LAB — CBC
HCT: 42.4 % (ref 39.0–52.0)
Hemoglobin: 13.9 g/dL (ref 13.0–17.0)
MCH: 26.2 pg (ref 26.0–34.0)
MCHC: 32.8 g/dL (ref 30.0–36.0)
MCV: 79.8 fL — ABNORMAL LOW (ref 80.0–100.0)
Platelets: 220 10*3/uL (ref 150–400)
RBC: 5.31 MIL/uL (ref 4.22–5.81)
RDW: 14.2 % (ref 11.5–15.5)
WBC: 7.8 10*3/uL (ref 4.0–10.5)
nRBC: 0 % (ref 0.0–0.2)

## 2019-07-20 LAB — COMPREHENSIVE METABOLIC PANEL
ALT: 24 U/L (ref 0–44)
AST: 21 U/L (ref 15–41)
Albumin: 4.2 g/dL (ref 3.5–5.0)
Alkaline Phosphatase: 54 U/L (ref 38–126)
Anion gap: 8 (ref 5–15)
BUN: 22 mg/dL (ref 8–23)
CO2: 27 mmol/L (ref 22–32)
Calcium: 9 mg/dL (ref 8.9–10.3)
Chloride: 104 mmol/L (ref 98–111)
Creatinine, Ser: 1.55 mg/dL — ABNORMAL HIGH (ref 0.61–1.24)
GFR calc Af Amer: 51 mL/min — ABNORMAL LOW (ref >60)
GFR calc non Af Amer: 44 mL/min — ABNORMAL LOW (ref >60)
Glucose, Bld: 112 mg/dL — ABNORMAL HIGH (ref 70–99)
Potassium: 4.6 mmol/L (ref 3.5–5.1)
Sodium: 139 mmol/L (ref 135–145)
Total Bilirubin: 0.6 mg/dL (ref 0.3–1.2)
Total Protein: 7.6 g/dL (ref 6.5–8.1)

## 2019-07-20 LAB — PROTIME-INR
INR: 1 (ref 0.8–1.2)
Prothrombin Time: 12.8 seconds (ref 11.4–15.2)

## 2019-07-20 LAB — APTT: aPTT: 37 seconds — ABNORMAL HIGH (ref 24–36)

## 2019-07-20 MED ORDER — SODIUM CHLORIDE 0.9% FLUSH: 3.0000 mL | Freq: Once | INTRAVENOUS | Status: DC

## 2019-07-20 NOTE — ED Notes (Signed)
Pt up to desk. Does not want to wait any longer. Pt alert and oriented. Ambulatory without difficulty.

## 2019-07-20 NOTE — ED Triage Notes (Signed)
First nurse note- started with numbness on right head/torso and leg at midnight this morning.  Ambulatory with steady gait.

## 2019-07-20 NOTE — ED Triage Notes (Signed)
Pt c/o numbness to right upper half of head, right leg and right abdomen/torso.  No fever.  Weakness to right leg per pt.

## 2019-07-20 NOTE — ED Notes (Signed)
Returned from ct 

## 2019-07-21 ENCOUNTER — Telehealth: Payer: Self-pay | Admitting: Emergency Medicine

## 2019-07-21 NOTE — Telephone Encounter (Signed)
Called patient due to lwot to inquire about condition and follow up plans. Left message.   

## 2020-08-03 IMAGING — CT CT HEAD W/O CM
3 series · 15 of 47 positions shown, 18 images · non-contrast
Comparison: December 10, 2017.

CLINICAL DATA: Right-sided body numbness.

EXAM:
CT HEAD WITHOUT CONTRAST
TECHNIQUE: Contiguous axial images were obtained from the base of the skull
through the vertex without intravenous contrast.

[Series 2: head wo · axial · 0.47mm/px · z∈[-149,-14]mm · 9 of 33 slices shown, 12 images]
[im 3/33  brain]
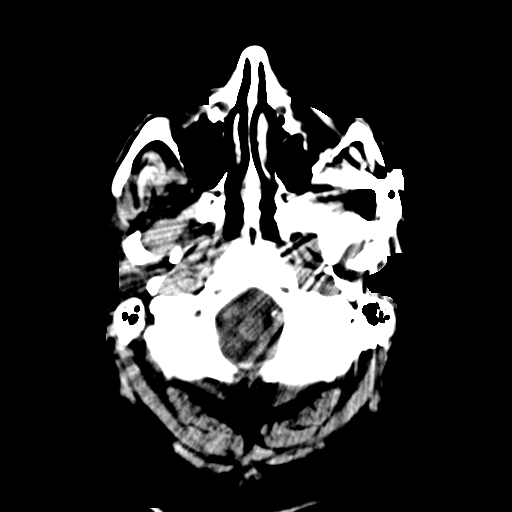
[im 3/33  bone]
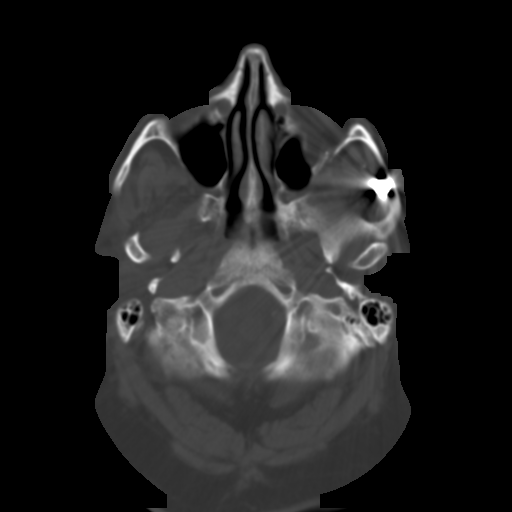
[im 6/33  brain]
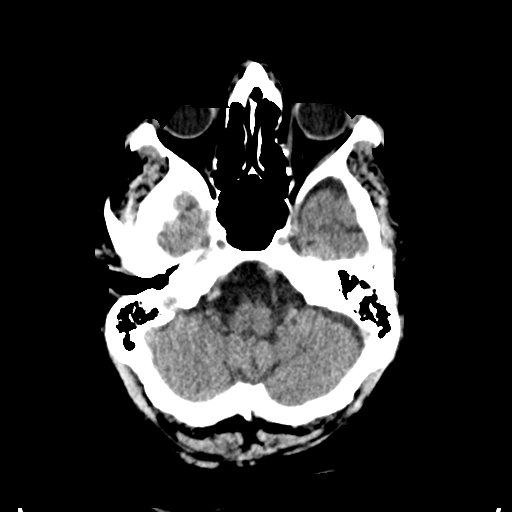
[im 9/33  brain]
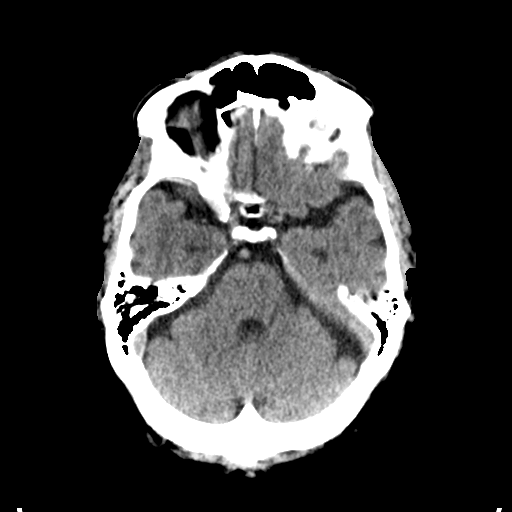
[im 13/33  brain]
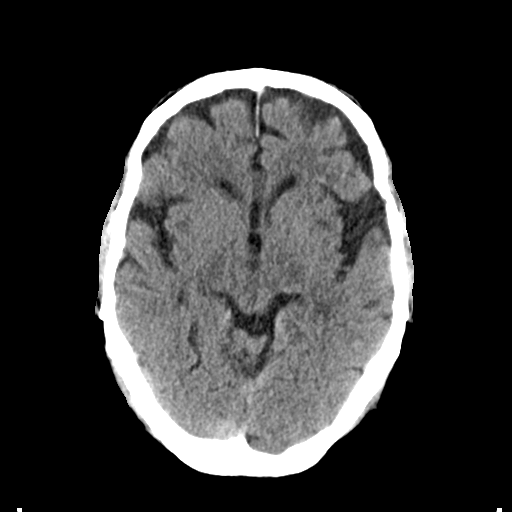
[im 17/33  brain]
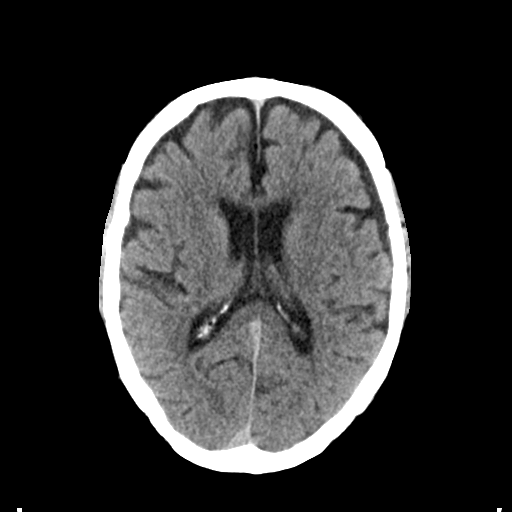
[im 17/33  bone]
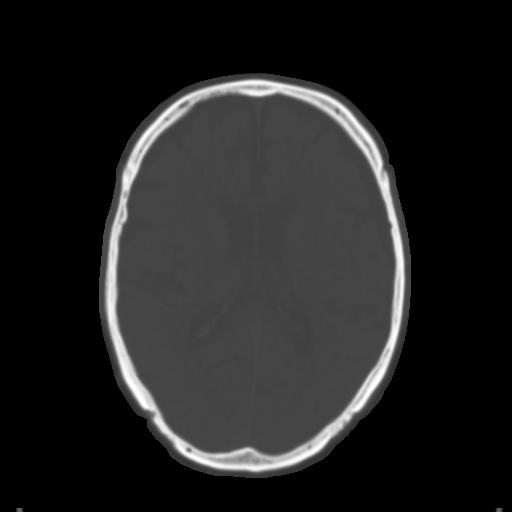
[im 20/33  brain]
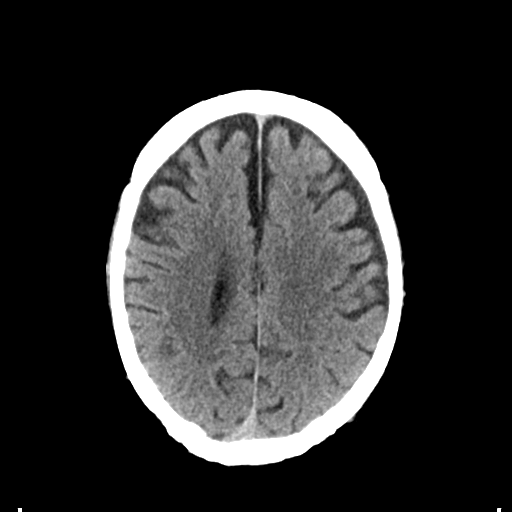
[im 24/33  brain]
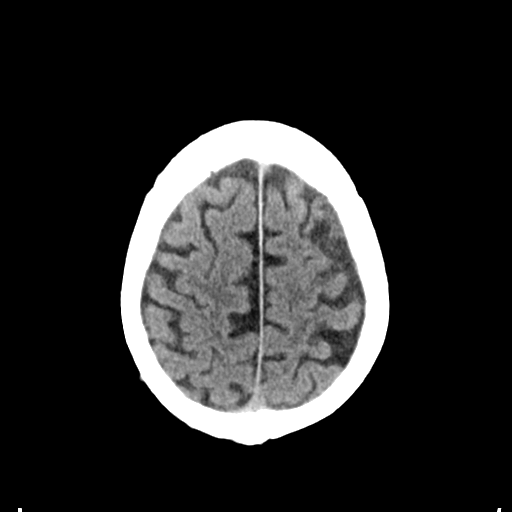
[im 27/33  brain]
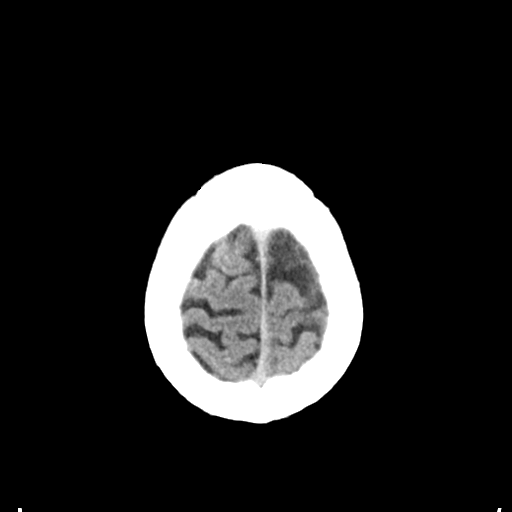
[im 30/33  brain]
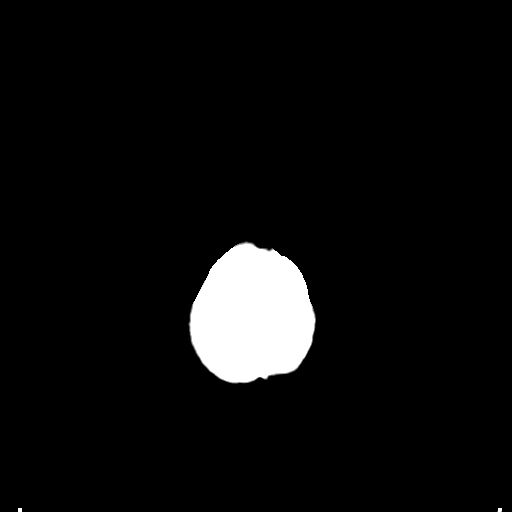
[im 30/33  bone]
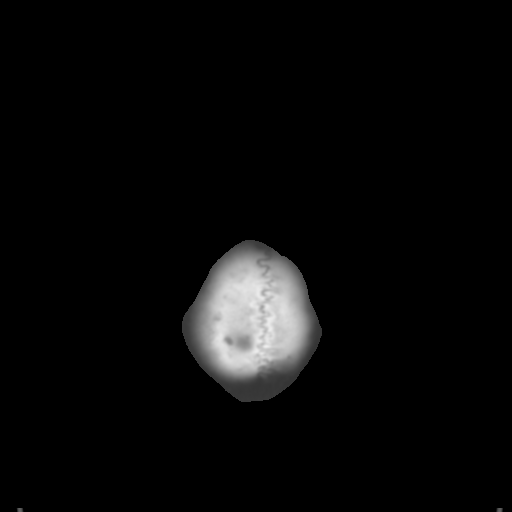

[Series 4: coronal soft tissue · coronal · 0.31mm/px · 3 of 71 slices shown]
[im 24/71  brain]
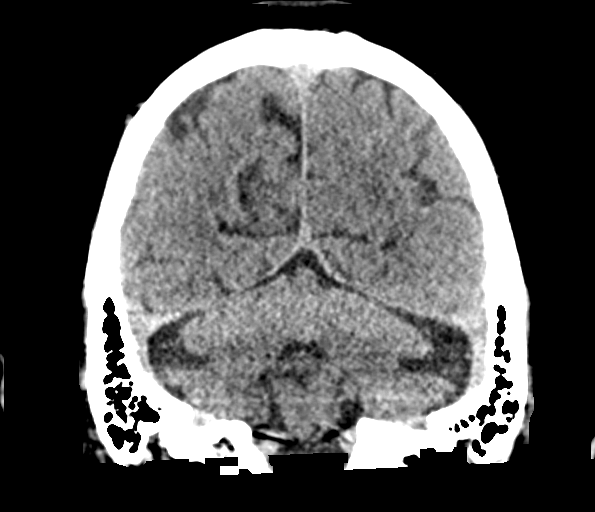
[im 32/71  brain]
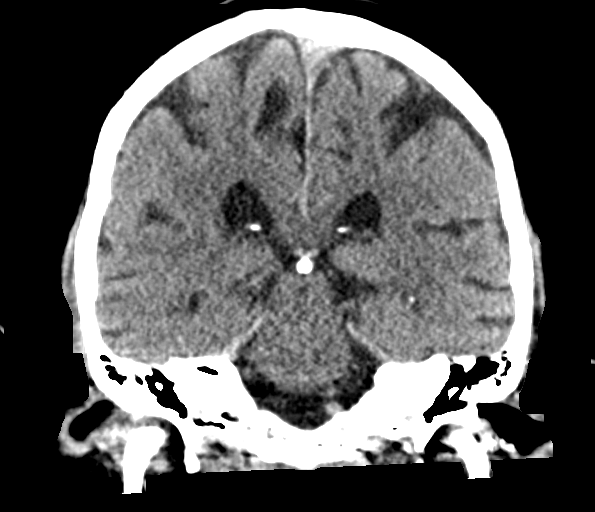
[im 39/71  brain]
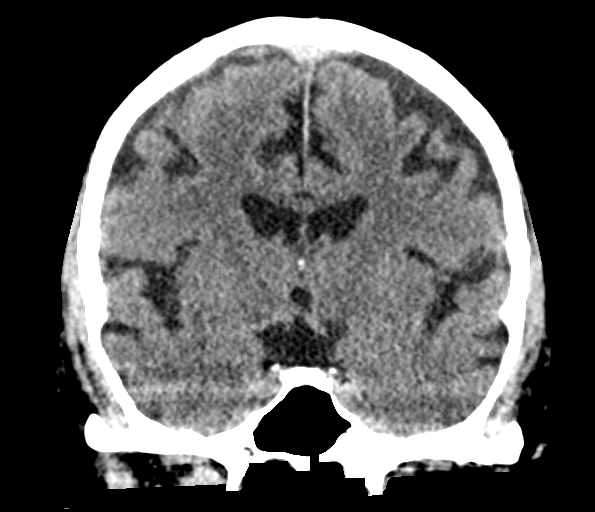

[Series 5: sagittal soft tissue · sagittal · 0.31mm/px · 3 of 62 slices shown]
[im 21/62  brain]
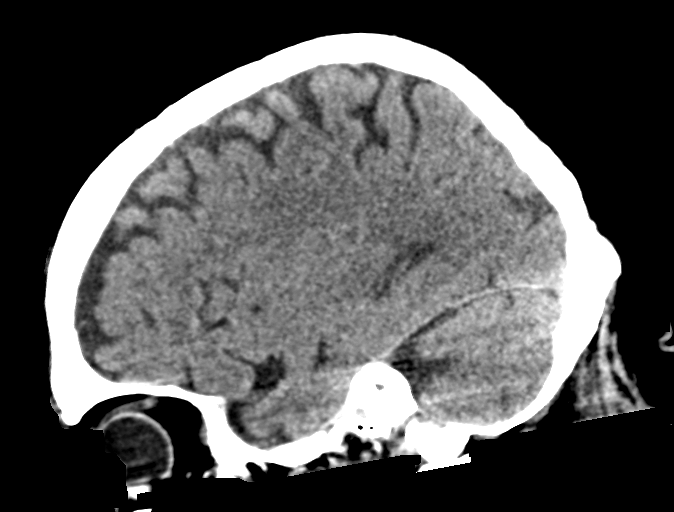
[im 31/62  brain]
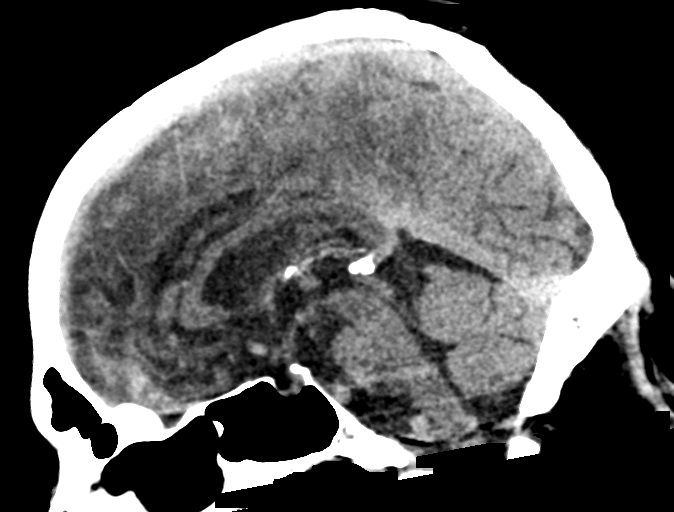
[im 41/62  brain]
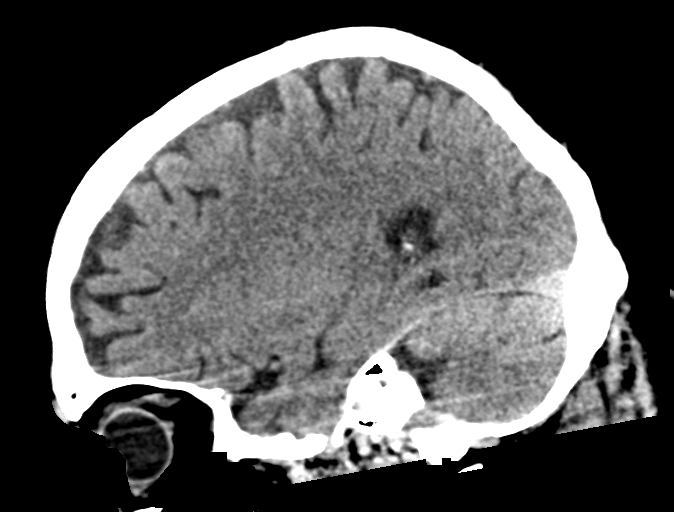

[15 of 47 positions shown; findings below may reference images not displayed]

FINDINGS: Brain: Mild diffuse cortical atrophy is noted. Mild chronic ischemic
white matter disease is noted. No mass effect or midline shift is
noted. Ventricular size is within normal limits. There is no
evidence of mass lesion, hemorrhage or acute infarction.

Vascular: No hyperdense vessel or unexpected calcification.

Skull: Normal. Negative for fracture or focal lesion.

Sinuses/Orbits: No acute finding.

Other: None.
IMPRESSION: Mild diffuse cortical atrophy. Mild chronic ischemic white matter
disease. No acute intracranial abnormality seen.
